# Patient Record
Sex: Female | Born: 1955 | Hispanic: Yes | Marital: Married | Smoking: Never smoker
Health system: Southern US, Community
[De-identification: ages and names within clinical notes are randomized; demographics above are authoritative.]

## PROBLEM LIST (undated history)

## (undated) DIAGNOSIS — E119 Type 2 diabetes mellitus without complications: Secondary | ICD-10-CM

## (undated) DIAGNOSIS — I10 Essential (primary) hypertension: Secondary | ICD-10-CM

## (undated) HISTORY — DX: Essential (primary) hypertension: I10

## (undated) HISTORY — DX: Type 2 diabetes mellitus without complications: E11.9

---

## 2004-10-30 ENCOUNTER — Ambulatory Visit: Payer: Self-pay

## 2007-09-10 ENCOUNTER — Ambulatory Visit: Payer: Self-pay | Admitting: Family Medicine

## 2007-09-15 ENCOUNTER — Ambulatory Visit: Payer: Self-pay | Admitting: Family Medicine

## 2012-12-20 ENCOUNTER — Ambulatory Visit: Payer: Self-pay

## 2015-07-13 ENCOUNTER — Ambulatory Visit
Admission: RE | Admit: 2015-07-13 | Discharge: 2015-07-13 | Disposition: A | Discharge status: Home / Self Care | Payer: Self-pay | Source: Ambulatory Visit | Attending: Family Medicine | Admitting: Family Medicine

## 2015-07-13 ENCOUNTER — Other Ambulatory Visit: Payer: Self-pay | Admitting: Family Medicine

## 2015-07-13 DIAGNOSIS — I517 Cardiomegaly: Secondary | ICD-10-CM | POA: Insufficient documentation

## 2015-07-13 DIAGNOSIS — R918 Other nonspecific abnormal finding of lung field: Secondary | ICD-10-CM | POA: Insufficient documentation

## 2015-07-13 DIAGNOSIS — J189 Pneumonia, unspecified organism: Secondary | ICD-10-CM

## 2015-07-13 DIAGNOSIS — Z8701 Personal history of pneumonia (recurrent): Secondary | ICD-10-CM | POA: Insufficient documentation

## 2016-01-23 ENCOUNTER — Ambulatory Visit: Payer: Self-pay | Attending: Oncology

## 2016-01-23 ENCOUNTER — Ambulatory Visit
Admission: RE | Admit: 2016-01-23 | Discharge: 2016-01-23 | Disposition: A | Discharge status: Home / Self Care | Payer: Self-pay | Source: Ambulatory Visit | Attending: Oncology | Admitting: Oncology

## 2016-01-23 VITALS — BP 143/76 | HR 53 | Temp 97.9°F | Ht 62.6 in | Wt 162.5 lb

## 2016-01-23 DIAGNOSIS — Z0000 Encounter for general adult medical examination without abnormal findings: Secondary | ICD-10-CM

## 2016-01-23 NOTE — Progress Notes (Signed)
Subjective:     Patient ID: Emily Rangel, female   DOB: 07/29/1955, 60 y.o.   MRN: [id number]  HPI   Review of Systems     Objective:   Physical Exam  Pulmonary/Chest: Right breast exhibits no inverted nipple, no mass, no nipple discharge, no skin change and no tenderness. Left breast exhibits no inverted nipple, no mass, no nipple discharge, no skin change and no tenderness. Breasts are symmetrical.       Assessment:     60 year old patient presents for [facility] clinic visit.  Patient screened, and meets BCCCP eligibility.  Patient does not have insurance, Medicare or Medicaid.  Handout given on Affordable Care Act. Instructed patient on breast self-exam using teach back method. CBE unremarkable.  Maritza Afanador interpreted exam.    Plan:     Sent for bilateral screening mammogram.

## 2016-01-24 NOTE — Progress Notes (Signed)
Letter mailed from [facility] to notify of normal mammogram results.  Patient to return in one year for annual screening.  Copy to [facility]. 

## 2017-03-04 ENCOUNTER — Ambulatory Visit: Payer: Self-pay | Attending: Oncology

## 2019-05-04 ENCOUNTER — Other Ambulatory Visit: Payer: Self-pay

## 2019-05-04 ENCOUNTER — Ambulatory Visit: Payer: Self-pay | Attending: Oncology | Admitting: *Deleted

## 2019-05-04 ENCOUNTER — Ambulatory Visit
Admission: RE | Admit: 2019-05-04 | Discharge: 2019-05-04 | Disposition: A | Discharge status: Home / Self Care | Payer: Self-pay | Source: Ambulatory Visit | Attending: Oncology | Admitting: Oncology

## 2019-05-04 ENCOUNTER — Encounter: Payer: Self-pay | Admitting: *Deleted

## 2019-05-04 VITALS — BP 146/63 | HR 64 | Temp 96.9°F | Ht 63.0 in | Wt 172.0 lb

## 2019-05-04 DIAGNOSIS — Z0000 Encounter for general adult medical examination without abnormal findings: Secondary | ICD-10-CM | POA: Insufficient documentation

## 2019-05-04 NOTE — Progress Notes (Signed)
  Subjective:     Patient ID: Emily Rangel, female   DOB: [birth year], 64 y.o.   MRN: [id number]  HPI   Review of Systems     Objective:   Physical Exam Chest:     Breasts:        Right: Tenderness present. No swelling, bleeding, inverted nipple, mass, nipple discharge or skin change.        Left: Tenderness present. No swelling, bleeding, inverted nipple, mass, nipple discharge or skin change.    Lymphadenopathy:     Upper Body:     Right upper body: No supraclavicular or axillary adenopathy.     Left upper body: No supraclavicular or axillary adenopathy.        Assessment:     64 year old Hispanic female returns to [facility] for annual screening.  Onalee Hua #[id number] from [facility] video interpreters used for interpretation.  Clinical breast exam unremarkable.  Taught self breast awareness.  Last pap 1/21 at the [facility].  Patient has been screened for eligibility.  She does not have any insurance, Medicare or Medicaid.  She also meets financial eligibility.   Risk Assessment    No risk assessment data        Plan:     Screening mammogram ordered.  Will follow up per B[facility]CP protocol.

## 2019-05-04 NOTE — Patient Instructions (Signed)
Gave patient hand-out, Women Staying Healthy, Active and Well from [facility]CP, with education on breast health, pap smears, heart and colon health. 

## 2019-05-05 ENCOUNTER — Encounter: Payer: Self-pay | Admitting: *Deleted

## 2019-05-05 NOTE — Progress Notes (Signed)
Letter mailed from the [facility] to inform patient of her normal mammogram results.  Patient is to follow-up with annual screening in one year. 

## 2020-09-15 ENCOUNTER — Emergency Department
Admission: EM | Admit: 2020-09-15 | Discharge: 2020-09-15 | Disposition: A | Discharge status: Home / Self Care | Payer: No Typology Code available for payment source | Attending: Emergency Medicine | Admitting: Emergency Medicine

## 2020-09-15 ENCOUNTER — Other Ambulatory Visit: Payer: Self-pay

## 2020-09-15 ENCOUNTER — Emergency Department: Payer: No Typology Code available for payment source

## 2020-09-15 ENCOUNTER — Encounter: Payer: Self-pay | Admitting: Emergency Medicine

## 2020-09-15 DIAGNOSIS — S161XXA Strain of muscle, fascia and tendon at neck level, initial encounter: Secondary | ICD-10-CM | POA: Diagnosis not present

## 2020-09-15 DIAGNOSIS — S20212A Contusion of left front wall of thorax, initial encounter: Secondary | ICD-10-CM | POA: Diagnosis not present

## 2020-09-15 DIAGNOSIS — R079 Chest pain, unspecified: Secondary | ICD-10-CM | POA: Diagnosis not present

## 2020-09-15 DIAGNOSIS — S199XXA Unspecified injury of neck, initial encounter: Secondary | ICD-10-CM | POA: Diagnosis present

## 2020-09-15 DIAGNOSIS — Y92410 Unspecified street and highway as the place of occurrence of the external cause: Secondary | ICD-10-CM | POA: Diagnosis not present

## 2020-09-15 DIAGNOSIS — S0990XA Unspecified injury of head, initial encounter: Secondary | ICD-10-CM | POA: Insufficient documentation

## 2020-09-15 DIAGNOSIS — S39012A Strain of muscle, fascia and tendon of lower back, initial encounter: Secondary | ICD-10-CM | POA: Diagnosis not present

## 2020-09-15 DIAGNOSIS — T07XXXA Unspecified multiple injuries, initial encounter: Secondary | ICD-10-CM

## 2020-09-15 DIAGNOSIS — S40011A Contusion of right shoulder, initial encounter: Secondary | ICD-10-CM | POA: Diagnosis not present

## 2020-09-15 DIAGNOSIS — S9001XA Contusion of right ankle, initial encounter: Secondary | ICD-10-CM | POA: Diagnosis not present

## 2020-09-15 LAB — CBG MONITORING, ED: Glucose-Capillary: 87 mg/dL (ref 70–99)

## 2020-09-15 MED ORDER — MELOXICAM 7.5 MG PO TABS
15.0000 mg | ORAL_TABLET | Freq: Once | ORAL | Status: AC
  Administered 2020-09-15: 15 mg via ORAL
  Filled 2020-09-15: qty 2

## 2020-09-15 MED ORDER — METHOCARBAMOL 500 MG PO TABS: 500.0000 mg | ORAL_TABLET | Freq: Four times a day (QID) | ORAL | 0 refills | Status: AC

## 2020-09-15 MED ORDER — MELOXICAM 15 MG PO TABS: 15.0000 mg | ORAL_TABLET | Freq: Every day | ORAL | 0 refills | Status: AC

## 2020-09-15 MED ORDER — METHOCARBAMOL 500 MG PO TABS
1000.0000 mg | ORAL_TABLET | Freq: Once | ORAL | Status: AC
  Administered 2020-09-15: 1000 mg via ORAL
  Filled 2020-09-15: qty 2
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
## 2020-09-15 NOTE — ED Notes (Signed)
Pt taken for scans 
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
## 2021-10-03 ENCOUNTER — Other Ambulatory Visit: Payer: Self-pay

## 2021-10-03 DIAGNOSIS — Z1231 Encounter for screening mammogram for malignant neoplasm of breast: Secondary | ICD-10-CM

## 2021-10-08 ENCOUNTER — Ambulatory Visit: Payer: Self-pay | Attending: Hematology and Oncology | Admitting: *Deleted

## 2021-10-08 ENCOUNTER — Ambulatory Visit
Admission: RE | Admit: 2021-10-08 | Discharge: 2021-10-08 | Disposition: A | Discharge status: Home / Self Care | Payer: Self-pay | Source: Ambulatory Visit | Attending: Obstetrics and Gynecology | Admitting: Obstetrics and Gynecology

## 2021-10-08 VITALS — BP 143/62 | Wt 173.1 lb

## 2021-10-08 DIAGNOSIS — Z1231 Encounter for screening mammogram for malignant neoplasm of breast: Secondary | ICD-10-CM

## 2021-10-08 DIAGNOSIS — Z01419 Encounter for gynecological examination (general) (routine) without abnormal findings: Secondary | ICD-10-CM

## 2021-10-08 NOTE — Progress Notes (Signed)
Emily Rangel is a 66 y.o. female who presents to [facility] clinic today with no complaints. Patient presents for clinical breast exam and mammogram.    Pap Smear: Pap not smear completed today. Last Pap smear was in 2021 per patient at [facility] with Dr. Seward Carol and was normal. Per patient has no history of an abnormal Pap smear. Last Pap smear result is not available in Epic.   Physical exam: Breasts Breasts symmetrical. No skin abnormalities bilateral breasts. No nipple retraction bilateral breasts. No nipple discharge bilateral breasts. No lymphadenopathy. No lumps palpated bilateral breasts.       Pelvic/Bimanual Pap is not indicated today    Smoking History: Patient has never smoked    Patient Navigation: Patient education provided.   Taught self breast awareness.  Access to services provided for patient through BCCCP program. [place], the [facility] interpreter provided interpretation. No transportation provided   Colorectal Cancer Screening: Per patient  she is scheduled for a colonoscopy.  No complaints today.    Breast and Cervical Cancer Risk Assessment: Patient does not have family history of breast cancer, known genetic mutations, or radiation treatment to the chest before age 1. Patient does not have history of cervical dysplasia, immunocompromised, or DES exposure in-utero.  Risk Assessment     Risk Scores       10/08/2021   Last edited by: Narda Rutherford, LPN   5-year risk: 0.7 %   Lifetime risk: 2.8 %            A: BCCCP exam without pap smear  P: Referred patient to the [facility] for a screening mammogram. Appointment scheduled today.  Jim Like, RN 10/08/2021 11:00 AM

## 2022-01-01 HISTORY — PX: CARDIAC SURGERY: SHX584

## 2022-08-05 ENCOUNTER — Other Ambulatory Visit: Payer: Self-pay

## 2022-08-05 DIAGNOSIS — Z1231 Encounter for screening mammogram for malignant neoplasm of breast: Secondary | ICD-10-CM

## 2022-10-13 ENCOUNTER — Ambulatory Visit
Admission: RE | Admit: 2022-10-13 | Discharge: 2022-10-13 | Disposition: A | Discharge status: Home / Self Care | Payer: Self-pay | Source: Ambulatory Visit | Attending: Obstetrics and Gynecology | Admitting: Obstetrics and Gynecology

## 2022-10-13 ENCOUNTER — Ambulatory Visit: Payer: Self-pay | Attending: Hematology and Oncology | Admitting: Hematology and Oncology

## 2022-10-13 VITALS — BP 134/61 | Wt 159.4 lb

## 2022-10-13 DIAGNOSIS — Z1231 Encounter for screening mammogram for malignant neoplasm of breast: Secondary | ICD-10-CM | POA: Insufficient documentation

## 2022-10-13 NOTE — Progress Notes (Signed)
[name] is a 67 y.o. female who presents to [facility] clinic today with no complaints .    Pap Smear: Pap not smear completed today. Last Pap smear was 04/05/2019 at [facility] clinic and was normal. Per patient has no history of an abnormal Pap smear. Last Pap smear result is not available in Epic. As she is now 67 years old, she will no longer need Pap smears.    Physical exam: Breasts Breasts symmetrical. No skin abnormalities bilateral breasts. No nipple retraction bilateral breasts. No nipple discharge bilateral breasts. No lymphadenopathy. No lumps palpated bilateral breasts.  MS DIGITAL SCREENING TOMO BILATERAL  Result Date: 10/09/2021 CLINICAL DATA:  Screening. EXAM: DIGITAL SCREENING BILATERAL MAMMOGRAM WITH TOMOSYNTHESIS AND CAD TECHNIQUE: Bilateral screening digital craniocaudal and mediolateral oblique mammograms were obtained. Bilateral screening digital breast tomosynthesis was performed. The images were evaluated with computer-aided detection. COMPARISON:  Previous exam(s). ACR Breast Density Category b: There are scattered areas of fibroglandular density. FINDINGS: There are no findings suspicious for malignancy. IMPRESSION: No mammographic evidence of malignancy. A result letter of this screening mammogram will be mailed directly to the patient. RECOMMENDATION: Screening mammogram in one year. (Code:SM-B-01Y) BI-RADS CATEGORY  1: Negative. Electronically Signed   By: Hulan Saas M.D.   On: 10/09/2021 14:32   MS DIGITAL SCREENING TOMO BILATERAL  Result Date: 05/04/2019 CLINICAL DATA:  Screening. EXAM: DIGITAL SCREENING BILATERAL MAMMOGRAM WITH TOMO AND CAD COMPARISON:  Previous exam(s). ACR Breast Density Category b: There are scattered areas of fibroglandular density. FINDINGS: There are no findings suspicious for malignancy. Images were processed with CAD. IMPRESSION: No mammographic evidence of malignancy. A result letter of this screening mammogram will be  mailed directly to the patient. RECOMMENDATION: Screening mammogram in one year. (Code:SM-B-01Y) BI-RADS CATEGORY  1: Negative. Electronically Signed   By: Frederico Hamman M.D.   On: 05/04/2019 11:25         Pelvic/Bimanual Pap is not indicated today    Smoking History: Patient has never smoked and was not referred to quit line.    Patient Navigation: Patient education provided. Access to services provided for patient through BCCCP program. Delos Haring interpreter provided. No transportation provided   Colorectal Cancer Screening: Per patient has never had colonoscopy completed No complaints today. Will schedule colonoscopy once off of blood thinners from heart surgery in 12/2021.   Breast and Cervical Cancer Risk Assessment: Patient does not have family history of breast cancer, known genetic mutations, or radiation treatment to the chest before age 54. Patient does not have history of cervical dysplasia, immunocompromised, or DES exposure in-utero.  Risk Assessment   No risk assessment data for the current encounter  Risk Scores       10/08/2021   Last edited by: Clarene Critchley, RT   5-year risk: 0.7%   Lifetime risk: 2.8%            A: BCCCP exam without pap smear No complaints with benign exam.   P: Referred patient to the [facility] for a screening mammogram. Appointment scheduled 10/13/22.  Ilda Basset A, NP 10/13/2022 9:50 AM

## 2022-10-13 NOTE — Patient Instructions (Signed)
Taught Silvestre Gunner about self breast awareness and gave educational materials to take home. Patient did not need a Pap smear today due to last Pap smear was in 04/05/19 per patient. She is now 67 years old with no history of abnormal results and will no longer need Pap smears.  Let her know BCCCP will cover Pap smears every 3 years unless has a history of abnormal Pap smears. Referred patient to the [facility] for diagnostic mammogram. Appointment scheduled for 10/13/22. Patient aware of appointment and will be there. Let patient know will follow up with her within the next couple weeks with results. Silvestre Gunner verbalized understanding.  Pascal Lux, NP 9:51 AM
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
## 2023-07-28 ENCOUNTER — Encounter: Payer: Self-pay | Admitting: Primary Care

## 2023-10-01 ENCOUNTER — Other Ambulatory Visit: Payer: Self-pay

## 2023-10-01 DIAGNOSIS — Z1231 Encounter for screening mammogram for malignant neoplasm of breast: Secondary | ICD-10-CM

## 2023-10-19 ENCOUNTER — Other Ambulatory Visit: Payer: Self-pay

## 2023-10-19 ENCOUNTER — Ambulatory Visit: Payer: Self-pay | Attending: Obstetrics and Gynecology | Admitting: *Deleted

## 2023-10-19 ENCOUNTER — Ambulatory Visit: Admission: RE | Admit: 2023-10-19 | Payer: Self-pay | Source: Ambulatory Visit

## 2023-10-19 VITALS — BP 127/56 | Wt 162.8 lb

## 2023-10-19 DIAGNOSIS — N6325 Unspecified lump in the left breast, overlapping quadrants: Secondary | ICD-10-CM

## 2023-10-19 DIAGNOSIS — Z1239 Encounter for other screening for malignant neoplasm of breast: Secondary | ICD-10-CM

## 2023-10-19 NOTE — Patient Instructions (Signed)
 Explained breast self awareness with Emily Rangel. Patient did not need a Pap smear today due to last Pap smear was 04/05/2019 and patient is over 68 years old with no history of an abnormal Pap smear. Let patient know that she doesn't need any further Pap smears due to being over 65 years with no history of an abnormal Pap smear. Referred patient to the [facility] for a diagnostic mammogram. Appointment scheduled Tuesday, October 20, 2023 at 1120. Patient aware of appointment and will be there. Emily Rangel verbalized understanding.  [name], Wanda Ship, RN 1:22 PM

## 2023-10-19 NOTE — Progress Notes (Signed)
 Emily Rangel is a 68 y.o. female who presents to [facility] clinic today with complaint of right breast lump x 1 year and occasional right breast pain.    Pap Smear: Pap smear not completed today. Last Pap smear was 04/05/2019 at [facility] clinic and was normal. Per patient has no history of an abnormal Pap smear. Last Pap smear result is available in Epic.   Physical exam: Breasts Breasts symmetrical. No skin abnormalities bilateral breasts. No nipple retraction bilateral breasts. No nipple discharge bilateral breasts. No lymphadenopathy. No lumps palpated right breast. Palpated a pea sized lump within the left breast at 9 o'clock 12 cm from the nipple. Complaints of right upper breast tenderness on exam.   MS 3D SCR MAMMO BILAT BR (aka MM) Result Date: 10/14/2022 CLINICAL DATA:  Screening. EXAM: DIGITAL SCREENING BILATERAL MAMMOGRAM WITH TOMOSYNTHESIS AND CAD TECHNIQUE: Bilateral screening digital craniocaudal and mediolateral oblique mammograms were obtained. Bilateral screening digital breast tomosynthesis was performed. The images were evaluated with computer-aided detection. COMPARISON:  Previous exam(s). ACR Breast Density Category b: There are scattered areas of fibroglandular density. FINDINGS: There are no findings suspicious for malignancy. IMPRESSION: No mammographic evidence of malignancy. A result letter of this screening mammogram will be mailed directly to the patient. RECOMMENDATION: Screening mammogram in one year. (Code:SM-B-01Y) BI-RADS CATEGORY  1: Negative. Electronically Signed   By: Alm Parkins M.D.   On: 10/14/2022 12:50   MS DIGITAL SCREENING TOMO BILATERAL Result Date: 10/09/2021 CLINICAL DATA:  Screening. EXAM: DIGITAL SCREENING BILATERAL MAMMOGRAM WITH TOMOSYNTHESIS AND CAD TECHNIQUE: Bilateral screening digital craniocaudal and mediolateral oblique mammograms were obtained. Bilateral screening digital breast tomosynthesis was performed. The images were  evaluated with computer-aided detection. COMPARISON:  Previous exam(s). ACR Breast Density Category b: There are scattered areas of fibroglandular density. FINDINGS: There are no findings suspicious for malignancy. IMPRESSION: No mammographic evidence of malignancy. A result letter of this screening mammogram will be mailed directly to the patient. RECOMMENDATION: Screening mammogram in one year. (Code:SM-B-01Y) BI-RADS CATEGORY  1: Negative. Electronically Signed   By: Debby Satterfield M.D.   On: 10/09/2021 14:32   MS DIGITAL SCREENING TOMO BILATERAL Result Date: 05/04/2019 CLINICAL DATA:  Screening. EXAM: DIGITAL SCREENING BILATERAL MAMMOGRAM WITH TOMO AND CAD COMPARISON:  Previous exam(s). ACR Breast Density Category b: There are scattered areas of fibroglandular density. FINDINGS: There are no findings suspicious for malignancy. Images were processed with CAD. IMPRESSION: No mammographic evidence of malignancy. A result letter of this screening mammogram will be mailed directly to the patient. RECOMMENDATION: Screening mammogram in one year. (Code:SM-B-01Y) BI-RADS CATEGORY  1: Negative. Electronically Signed   By: Rosaline Collet M.D.   On: 05/04/2019 11:25    Pelvic/Bimanual Pap is not indicated today per BCCCP guidelines.   Smoking History: Patient has never smoked.   Patient Navigation: Patient education provided. Access to services provided for patient through [employer] program. Spanish interpreter Emily Rangel from [facility] provided.   Colorectal Cancer Screening: Per patient has never had colonoscopy completed 09/10/2023. No complaints today.    Breast and Cervical Cancer Risk Assessment: Patient does not have family history of breast cancer, known genetic mutations, or radiation treatment to the chest before age 43. Patient does not have history of cervical dysplasia, immunocompromised, or DES exposure in-utero.  Risk Scores as of Encounter on 10/19/2023     Emily Rangel            5-year 1.39%   Lifetime 4.55%   This patient is  [place] but has no documented birth country, so the [place] model used data from [place] patients to calculate their risk score. Document a birth country in the Demographics activity for a more accurate score.         Last calculated by Emily Tempie SQUIBB, LPN on 03/21/7972 at  1:00 PM        A: BCCCP exam without pap smear Complaint of left breast lump.  P: Referred patient to the [facility] for a diagnostic mammogram. Appointment scheduled Tuesday, October 20, 2023 at 1120.  Emily Wanda SQUIBB, RN 10/19/2023 1:22 PM

## 2023-10-20 ENCOUNTER — Ambulatory Visit
Admission: RE | Admit: 2023-10-20 | Discharge: 2023-10-20 | Disposition: A | Discharge status: Home / Self Care | Payer: Self-pay | Source: Ambulatory Visit | Attending: Obstetrics and Gynecology | Admitting: Obstetrics and Gynecology

## 2023-10-20 DIAGNOSIS — N6325 Unspecified lump in the left breast, overlapping quadrants: Secondary | ICD-10-CM | POA: Insufficient documentation
